# Patient Record
Sex: Female | Born: 1975 | Race: Black or African American | Hispanic: No | Marital: Married
Health system: Southern US, Community
[De-identification: ages and names within clinical notes are randomized; demographics above are authoritative.]

---

## 2008-05-12 ENCOUNTER — Emergency Department (HOSPITAL_BASED_OUTPATIENT_CLINIC_OR_DEPARTMENT_OTHER): Admission: EM | Admit: 2008-05-12 | Discharge: 2008-05-12 | Payer: Self-pay | Admitting: Emergency Medicine

## 2008-05-12 ENCOUNTER — Ambulatory Visit: Payer: Self-pay | Admitting: Diagnostic Radiology

## 2009-11-16 ENCOUNTER — Emergency Department (HOSPITAL_BASED_OUTPATIENT_CLINIC_OR_DEPARTMENT_OTHER)
Admission: EM | Admit: 2009-11-16 | Discharge: 2009-11-17 | Payer: Self-pay | Source: Home / Self Care | Admitting: Emergency Medicine

## 2010-01-24 IMAGING — CR DG CHEST 2V
2 series · 2 of 2 positions shown · non-contrast
Comparison: None

CLINICAL DATA: Tachycardia, weakness

CHEST - 2 VIEW

[w chest pa]
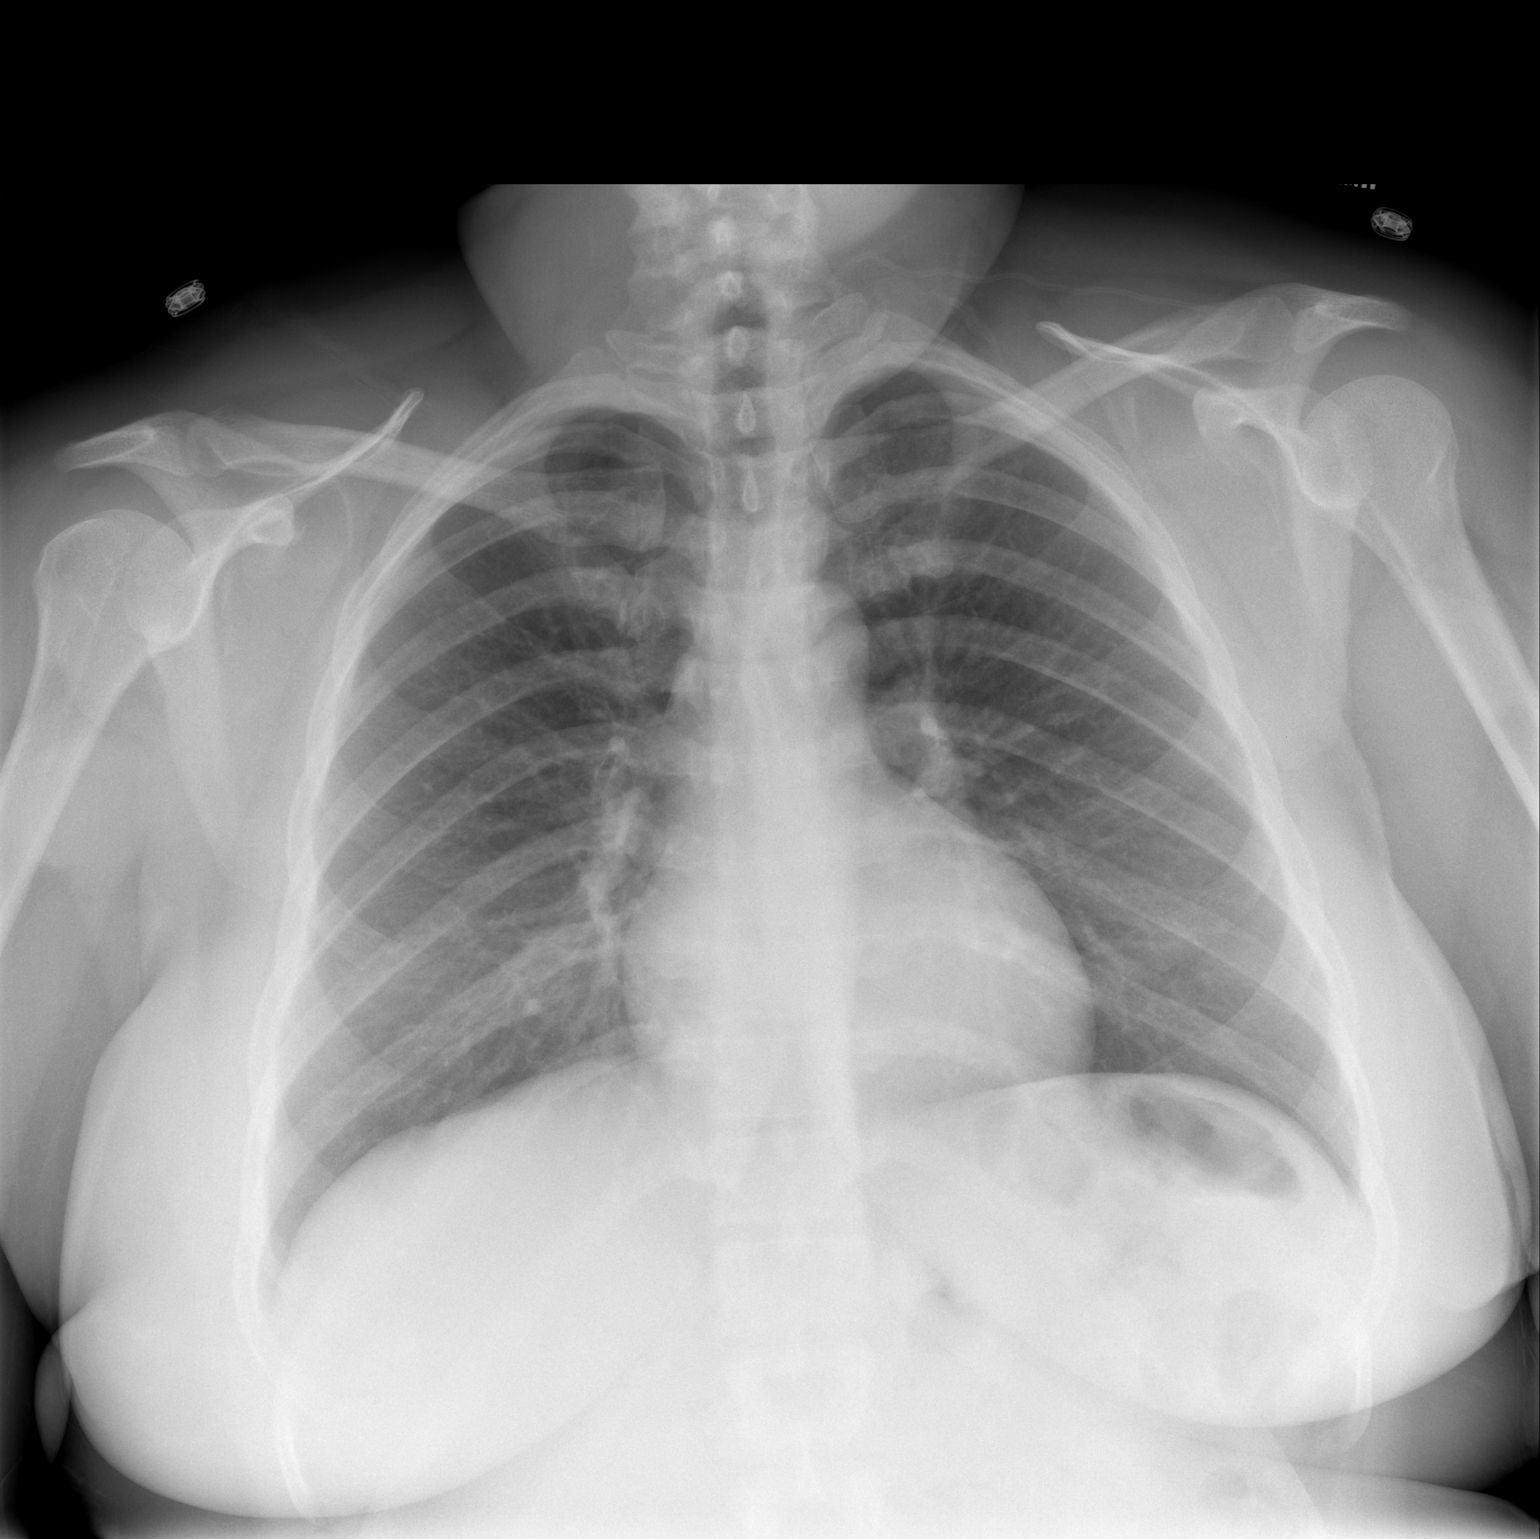

[w chest lat]
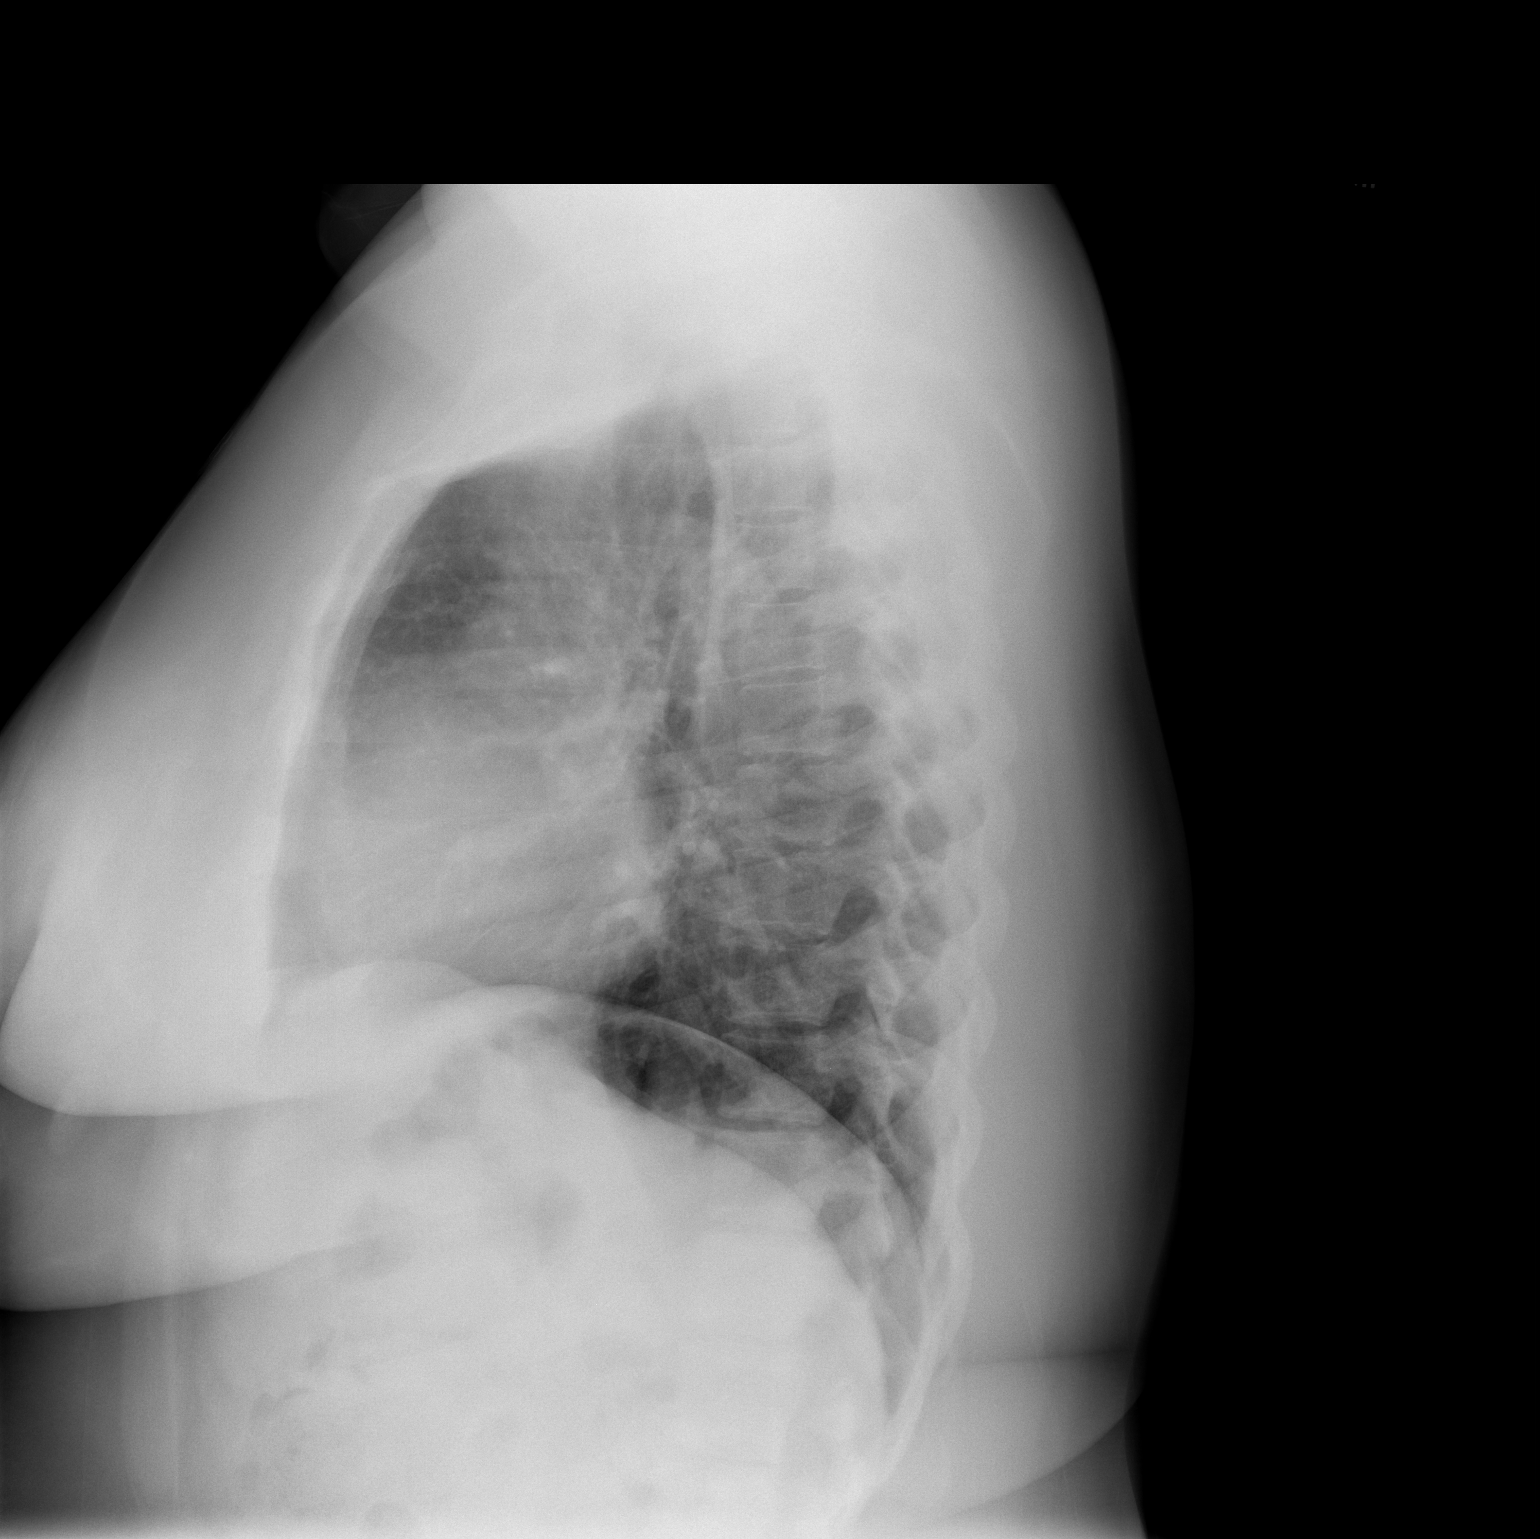

[2 of 2 positions shown; findings below may reference images not displayed]

FINDINGS: Cardiomediastinal silhouette is within normal limits. The
lungs are clear. No pleural effusion.  No pneumothorax.  No acute
osseous abnormality.
IMPRESSION: No acute cardiopulmonary process.

## 2010-07-19 LAB — CBC
HCT: 36.9 % (ref 36.0–46.0)
Hemoglobin: 12.5 g/dL (ref 12.0–15.0)
MCHC: 33.8 g/dL (ref 30.0–36.0)
RBC: 4.37 MIL/uL (ref 3.87–5.11)
WBC: 5.2 10*3/uL (ref 4.0–10.5)

## 2010-07-19 LAB — DIFFERENTIAL
Lymphocytes Relative: 39 % (ref 12–46)
Lymphs Abs: 2.1 10*3/uL (ref 0.7–4.0)
Monocytes Absolute: 0.5 10*3/uL (ref 0.1–1.0)
Monocytes Relative: 9 % (ref 3–12)
Neutro Abs: 2.5 10*3/uL (ref 1.7–7.7)

## 2010-07-19 LAB — URINE MICROSCOPIC-ADD ON

## 2010-07-19 LAB — BASIC METABOLIC PANEL
GFR calc non Af Amer: 56 mL/min — ABNORMAL LOW (ref >60)
Glucose, Bld: 135 mg/dL — ABNORMAL HIGH (ref 70–99)
Potassium: 3.5 mEq/L (ref 3.5–5.1)
Sodium: 143 mEq/L (ref 135–145)

## 2010-07-19 LAB — URINALYSIS, ROUTINE W REFLEX MICROSCOPIC
Glucose, UA: NEGATIVE mg/dL
Specific Gravity, Urine: 1.025 (ref 1.005–1.030)
pH: 6 (ref 5.0–8.0)

## 2010-08-18 LAB — URINALYSIS, ROUTINE W REFLEX MICROSCOPIC
Hgb urine dipstick: NEGATIVE
Nitrite: NEGATIVE
Protein, ur: NEGATIVE mg/dL
Specific Gravity, Urine: 1.019 (ref 1.005–1.030)
Urobilinogen, UA: 0.2 mg/dL (ref 0.0–1.0)

## 2010-08-18 LAB — BASIC METABOLIC PANEL
Calcium: 9.7 mg/dL (ref 8.4–10.5)
GFR calc non Af Amer: >60 mL/min (ref >60)
Potassium: 3.4 mEq/L — ABNORMAL LOW (ref 3.5–5.1)
Sodium: 139 mEq/L (ref 135–145)

## 2010-08-18 LAB — CBC
HCT: 40.7 % (ref 36.0–46.0)
Hemoglobin: 13.8 g/dL (ref 12.0–15.0)
Platelets: 253 10*3/uL (ref 150–400)
WBC: 6.1 10*3/uL (ref 4.0–10.5)

## 2010-08-18 LAB — DIFFERENTIAL
Basophils Absolute: 0.2 10*3/uL — ABNORMAL HIGH (ref 0.0–0.1)
Eosinophils Relative: 2 % (ref 0–5)
Lymphocytes Relative: 40 % (ref 12–46)
Lymphs Abs: 2.5 10*3/uL (ref 0.7–4.0)
Monocytes Absolute: 0.6 10*3/uL (ref 0.1–1.0)
Neutro Abs: 2.7 10*3/uL (ref 1.7–7.7)

## 2010-08-18 LAB — D-DIMER, QUANTITATIVE: D-Dimer, Quant: 0.31 ug/mL-FEU (ref 0.00–0.48)

## 2019-07-27 ENCOUNTER — Ambulatory Visit: Payer: Self-pay | Attending: Family

## 2019-07-27 DIAGNOSIS — Z23 Encounter for immunization: Secondary | ICD-10-CM

## 2019-07-27 NOTE — Progress Notes (Signed)
   Cov[facility] Vaccination [facility]  Name:  Monick Rena    MRN: [id number] DOB: Mar 18, 1976  07/27/2019  Ms. Kreager was observed post Covid-19 immunization for 15 minutes without incident. She was provided with Vaccine Information Sheet and instruction to access the V-Safe system.   Ms. Eickholt was instructed to call 911 with any severe reactions post vaccine: Marland Kitchen Difficulty breathing  . Swelling of face and throat  . A fast heartbeat  . A bad rash all over body  . Dizziness and weakness   Immunizations Administered    Name Date Dose VIS Date Route   Moderna COVID-19 Vaccine 07/27/2019  2:53 PM 0.5 mL 04/04/2019 Intramuscular   Manufacturer: Moderna   Lot: [id number]   NDC: 35361-443-15

## 2019-08-29 ENCOUNTER — Ambulatory Visit: Payer: Self-pay | Attending: Family

## 2019-08-29 DIAGNOSIS — Z23 Encounter for immunization: Secondary | ICD-10-CM

## 2019-08-29 NOTE — Progress Notes (Signed)
   Covid-19 Vaccination Clinic  Name:  Heather Chung    MRN: [id number] DOB: 1976/03/16  08/29/2019  [name] was observed post Covid-19 immunization for 15 minutes without incident. She was provided with Vaccine Information Sheet and instruction to access the V-Safe system.   [name] was instructed to call 911 with any severe reactions post vaccine: Marland Kitchen Difficulty breathing  . Swelling of face and throat  . A fast heartbeat  . A bad rash all over body  . Dizziness and weakness   Immunizations Administered    Name Date Dose VIS Date Route   Moderna COVID-19 Vaccine 08/29/2019  3:42 PM 0.5 mL 04/2019 Intramuscular   Manufacturer: Moderna   Lot: [id number]   NDC: 67341-937-90
